# Patient Record
Sex: Female | Born: 1965 | Race: Black or African American | Hispanic: No | Marital: Single | Smoking: Current every day smoker
Health system: Southern US, Community
[De-identification: ages and names within clinical notes are randomized; demographics above are authoritative.]

## PROBLEM LIST (undated history)

## (undated) DIAGNOSIS — I2510 Atherosclerotic heart disease of native coronary artery without angina pectoris: Secondary | ICD-10-CM

## (undated) DIAGNOSIS — I10 Essential (primary) hypertension: Secondary | ICD-10-CM

## (undated) DIAGNOSIS — E7800 Pure hypercholesterolemia, unspecified: Secondary | ICD-10-CM

## (undated) HISTORY — PX: CORONARY ANGIOPLASTY WITH STENT PLACEMENT: SHX49

---

## 2013-06-14 ENCOUNTER — Emergency Department (HOSPITAL_COMMUNITY)
Admission: EM | Admit: 2013-06-14 | Discharge: 2013-06-15 | Disposition: A | Discharge status: Home / Self Care | Payer: Medicaid Other | Attending: Emergency Medicine | Admitting: Emergency Medicine

## 2013-06-14 ENCOUNTER — Encounter (HOSPITAL_COMMUNITY): Payer: Self-pay | Admitting: Emergency Medicine

## 2013-06-14 ENCOUNTER — Emergency Department (HOSPITAL_COMMUNITY): Payer: Medicaid Other

## 2013-06-14 DIAGNOSIS — F17200 Nicotine dependence, unspecified, uncomplicated: Secondary | ICD-10-CM | POA: Insufficient documentation

## 2013-06-14 DIAGNOSIS — I2510 Atherosclerotic heart disease of native coronary artery without angina pectoris: Secondary | ICD-10-CM | POA: Insufficient documentation

## 2013-06-14 DIAGNOSIS — R0602 Shortness of breath: Secondary | ICD-10-CM

## 2013-06-14 DIAGNOSIS — I10 Essential (primary) hypertension: Secondary | ICD-10-CM | POA: Insufficient documentation

## 2013-06-14 DIAGNOSIS — R519 Headache, unspecified: Secondary | ICD-10-CM

## 2013-06-14 DIAGNOSIS — E7800 Pure hypercholesterolemia, unspecified: Secondary | ICD-10-CM | POA: Insufficient documentation

## 2013-06-14 DIAGNOSIS — R51 Headache: Secondary | ICD-10-CM | POA: Insufficient documentation

## 2013-06-14 HISTORY — DX: Atherosclerotic heart disease of native coronary artery without angina pectoris: I25.10

## 2013-06-14 HISTORY — DX: Pure hypercholesterolemia, unspecified: E78.00

## 2013-06-14 HISTORY — DX: Essential (primary) hypertension: I10

## 2013-06-14 LAB — COMPREHENSIVE METABOLIC PANEL
ALT: 20 U/L (ref 0–35)
AST: 29 U/L (ref 0–37)
Albumin: 4.2 g/dL (ref 3.5–5.2)
Alkaline Phosphatase: 73 U/L (ref 39–117)
BUN: 14 mg/dL (ref 6–23)
CALCIUM: 9.8 mg/dL (ref 8.4–10.5)
CO2: 23 mEq/L (ref 19–32)
CREATININE: 0.92 mg/dL (ref 0.50–1.10)
Chloride: 102 mEq/L (ref 96–112)
GFR calc Af Amer: 85 mL/min — ABNORMAL LOW (ref >90)
GFR, EST NON AFRICAN AMERICAN: 74 mL/min — AB (ref >90)
Glucose, Bld: 96 mg/dL (ref 70–99)
Potassium: 3.9 mEq/L (ref 3.7–5.3)
Sodium: 139 mEq/L (ref 137–147)
TOTAL PROTEIN: 7.9 g/dL (ref 6.0–8.3)
Total Bilirubin: 0.2 mg/dL — ABNORMAL LOW (ref 0.3–1.2)

## 2013-06-14 LAB — D-DIMER, QUANTITATIVE (NOT AT ARMC)

## 2013-06-14 LAB — CBC
HCT: 33 % — ABNORMAL LOW (ref 36.0–46.0)
Hemoglobin: 10.8 g/dL — ABNORMAL LOW (ref 12.0–15.0)
MCH: 25.5 pg — ABNORMAL LOW (ref 26.0–34.0)
MCHC: 32.7 g/dL (ref 30.0–36.0)
MCV: 77.8 fL — AB (ref 78.0–100.0)
Platelets: 156 10*3/uL (ref 150–400)
RBC: 4.24 MIL/uL (ref 3.87–5.11)
RDW: 14.2 % (ref 11.5–15.5)
WBC: 9 10*3/uL (ref 4.0–10.5)

## 2013-06-14 LAB — TROPONIN I: Troponin I: <0.3 ng/mL (ref <0.30)

## 2013-06-14 MED ORDER — SODIUM CHLORIDE 0.9 % IV BOLUS (SEPSIS)
1000.0000 mL | Freq: Once | INTRAVENOUS | Status: AC
  Administered 2013-06-14: 1000 mL via INTRAVENOUS

## 2013-06-14 MED ORDER — ASPIRIN 325 MG PO TABS
325.0000 mg | ORAL_TABLET | Freq: Once | ORAL | Status: AC
  Administered 2013-06-14: 325 mg via ORAL
  Filled 2013-06-14: qty 1

## 2013-06-14 MED ORDER — IPRATROPIUM-ALBUTEROL 0.5-2.5 (3) MG/3ML IN SOLN
3.0000 mL | Freq: Once | RESPIRATORY_TRACT | Status: AC
  Administered 2013-06-14: 3 mL via RESPIRATORY_TRACT
  Filled 2013-06-14: qty 3

## 2013-06-14 MED ORDER — KETOROLAC TROMETHAMINE 30 MG/ML IJ SOLN
30.0000 mg | Freq: Once | INTRAMUSCULAR | Status: AC
  Administered 2013-06-14: 30 mg via INTRAVENOUS
  Filled 2013-06-14: qty 1

## 2013-06-14 NOTE — ED Notes (Signed)
MD at bedside. 

## 2013-06-14 NOTE — ED Provider Notes (Signed)
CSN: [id number]     Arrival date & time 06/14/13  2041 History   First MD Initiated Contact with Patient 06/14/13 2112     Chief Complaint  Patient presents with  . Shortness of Breath  . Headache     (Consider location/radiation/quality/duration/timing/severity/associated sxs/prior Treatment) Patient is a 48 y.o. female presenting with shortness of breath. The history is provided by the patient.  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 Hypertension   . High cholesterol   . CAD (coronary artery disease)    Past Surgical History  Procedure Laterality Date  . Coronary angioplasty with stent placement     Family History  Problem Relation Age of Onset  . Hypertension Other   . Diabetes Other   . Cancer Other   . CAD Other    History  Substance Use Topics  . Smoking status: Current Every Day Smoker  . Smokeless tobacco: Not on file  . Alcohol Use: Yes   OB History   Grav Para Term Preterm Abortions TAB SAB Ect Mult Living                 Review of Systems  Constitutional: Negative for fever and chills.  Respiratory: Negative for cough and shortness of breath.   Gastrointestinal: Negative for vomiting and abdominal pain.  All other systems reviewed and are negative.     Allergies  Review of patient's allergies indicates no known allergies.  Home Medications   Prior to Admission medications   Not on File   LMP 06/03/2013 Physical Exam  Nursing note and vitals reviewed. Constitutional: She is oriented  to person, place, and time. She appears well-developed and well-nourished. No distress.  HENT:  Head: Normocephalic and atraumatic.  Mouth/Throat: Oropharynx is clear and moist.  Eyes: EOM are normal. Pupils are equal, round, and reactive to light.  Neck: Normal range of motion. Neck supple.  Cardiovascular: Normal rate and regular rhythm.  Exam reveals no friction rub.   No murmur heard. Pulmonary/Chest: Effort normal and breath sounds normal. No respiratory distress. She has no wheezes. She has no rales.  Abdominal: Soft. She exhibits no distension. There is no tenderness. There is no rebound.  Musculoskeletal: Normal range of motion. She exhibits no edema.  Neurological: She is alert and oriented to person, place, and time. No cranial nerve deficit. She exhibits abnormal muscle tone. Coordination normal.  Skin: She is not diaphoretic.    ED Course  Procedures (including critical care time) Labs Review Labs Reviewed  CBC  COMPREHENSIVE METABOLIC PANEL  TROPONIN I    Imaging Review No results found.   EKG Interpretation   Date/Time:  Thursday June 14 2013 20:50:06 EDT Ventricular Rate:  77 PR Interval:  152 QRS Duration: 81 QT Interval:  420 QTC Calculation: 475 R Axis:   58 Text Interpretation:  Sinus rhythm Left ventricular hypertrophy No prior  Confirmed by Gwendolyn Grant  MD, Shanya Ferriss ([id number]) on 06/14/2013 9:16:55 PM      MDM   Final diagnoses:  Shortness  of breath  Headache    48 roll female presents with sudden onset of shortness of breath according somewhat house. She is a vacuum Soil scientistcleaner saleswoman. Also has had a headache all day today. Woke up with it. [place] in [place] for the past few days. No cough, no fever. No CP. Hx of cardiac stent, initial presentation for that was extreme chest tightness, does not feel like that today. AFVSS here. Hypertensive. Lungs with decreased air movement, belly benign. No wheezing. She is a smoker. Will give aspirin, duoneb.  Will check d-dimer, CXR. Labs ok. Feeling better, on re-exam, sleeping comfortably. Serial troponin normal. Stable for discharge. Encouraged her to f/u with PCP and Cards.  Dagmar HaitWilliam Sandor Arboleda, MD 06/15/13 [phone]

## 2013-06-14 NOTE — ED Notes (Signed)
Pt states she had a sudden onset of shortness of breath about an hour ago  Pt states she has pain in the back of her head that started last night  Pt states she has been off her medications for about a month  Pt is unsure what she is supposed to take  Pt had a cardiac stent put in last July  Pt states her eating habits have been bad lately

## 2013-06-15 LAB — I-STAT TROPONIN, ED: TROPONIN I, POC: 0.01 ng/mL (ref 0.00–0.08)

## 2013-06-15 NOTE — Discharge Instructions (Signed)
Shortness of Breath Shortness of breath means you have trouble breathing. Shortness of breath may indicate that you have a medical problem. You should seek immediate medical care for shortness of breath. CAUSES   Not enough oxygen in the air (as with high altitudes or a smoke-filled room).  Short-term (acute) lung disease, including:  Infections, such as pneumonia.  Fluid in the lungs, such as heart failure.  A blood clot in the lungs (pulmonary embolism).  Long-term (chronic) lung diseases.  Heart disease (heart attack, angina, heart failure, and others).  Low red blood cells (anemia).  Poor physical fitness. This can cause shortness of breath when you exercise.  Chest or back injuries or stiffness.  Being overweight.  Smoking.  Anxiety. This can make you feel like you are not getting enough air. DIAGNOSIS  Serious medical problems can usually be found during your physical exam. Tests may also be done to determine why you are having shortness of breath. Tests may include:  Chest X-rays.  Lung function tests.  Blood tests.  Electrocardiography.  Exercise testing.  Echocardiography.  Imaging scans. Your caregiver may not be able to find a cause for your shortness of breath after your exam. In this case, it is important to have a follow-up exam with your caregiver as directed.  TREATMENT  Treatment for shortness of breath depends on the cause of your symptoms and can vary greatly. HOME CARE INSTRUCTIONS   Do not smoke. Smoking is a common cause of shortness of breath. If you smoke, ask for help to quit.  Avoid being around chemicals or things that may bother your breathing, such as paint fumes and dust.  Rest as needed. Slowly resume your usual activities.  If medicines were prescribed, take them as directed for the full length of time directed. This includes oxygen and any inhaled medicines.  Keep all follow-up appointments as directed by your caregiver. SEEK  MEDICAL CARE IF:   Your condition does not improve in the time expected.  You have a hard time doing your normal activities even with rest.  You have any side effects or problems with the medicines prescribed.  You develop any new symptoms. SEEK IMMEDIATE MEDICAL CARE IF:   Your shortness of breath gets worse.  You feel lightheaded, faint, or develop a cough not controlled with medicines.  You start coughing up blood.  You have pain with breathing.  You have chest pain or pain in your arms, shoulders, or abdomen.  You have a fever.  You are unable to walk up stairs or exercise the way you normally do. MAKE SURE YOU:  Understand these instructions.  Will watch your condition.  Will get help right away if you are not doing well or get worse. Document Released: 09/15/2000 Document Revised: 06/22/2011 Document Reviewed: 03/08/2011 [facility] Patient Information 2014 [place], [place].   Emergency Department Resource Guide 1) Find a Doctor and Pay Out of Pocket Although you won't have to find out who is covered by your insurance plan, it is a good idea to ask around and get recommendations. You will then need to call the office and see if the doctor you have chosen will accept you as a new patient and what types of options they offer for patients who are self-pay. Some doctors offer discounts or will set up payment plans for their patients who do not have insurance, but you will need to ask so you aren't surprised when you get to your appointment.  2) Contact Your Local  Health Department Not all health departments have doctors that can see patients for sick visits, but many do, so it is worth a call to see if yours does. If you don't know where your local health department is, you can check in your phone book. The CDC also has a tool to help you locate your state's health department, and many state websites also have listings of all of their local health departments.  3) Find a  [facility] If your illness is not likely to be very severe or complicated, you may want to try a walk in clinic. These are popping up all over the country in pharmacies, drugstores, and shopping centers. They're usually staffed by nurse practitioners or physician assistants that have been trained to treat common illnesses and complaints. They're usually fairly quick and inexpensive. However, if you have serious medical issues or chronic medical problems, these are probably not your best option.  No Primary Care Doctor: - Call Health Connect at  [phone] - they can help you locate a primary care doctor that  accepts your insurance, provides certain services, etc. - Physician Referral Service- [phone]  Chronic Pain Problems: Organization         Address  Phone   Notes  [facility]  [phone] Patients need to be referred by their primary care doctor.   Medication Assistance: Organization         Address  Phone   Notes  [facility] Medication [facility] [address] [place], [place] [address] [phone] --Must be a resident of [facility] -- Must have NO insurance coverage whatsoever (no Medicaid/ Medicare, etc.) -- The pt. MUST have a primary care doctor that directs their care regularly and follows them in the community   MedAssist  [phone]   [employer]  [phone]    Agencies that provide inexpensive medical care: Organization         Address  Phone   Notes  [place]  [phone]   [facility]    [phone]   [facility] [place], [place] [address] [phone]   [place] [address], [place] [phone]   Planned Parenthood    [phone]   [facility]    [phone]   [place] and [place] [address], [place] Phone:  [phone], Fax:  [phone] Hours of Operation:  9 am - 6 pm, M-F.  Also accepts Medicaid/Medicare and self-pay.  [facility] for [place], [address], [place] Phone: [phone], Fax: [phone]. Hours of Operation:  8:30 am - 5:30 pm, M-F.  Also accepts Medicaid and self-pay.  [facility] [address], [place] Phone: [phone]   [place], [place], [place] [phone], Ext. [phone] Mondays & Thursdays: 7-9 AM.  First 15 patients are seen on a first come, first serve basis.    [place] Providers:  Organization         Address  Phone   Notes  [facility] [address], [place] [phone] Also accepts self-pay patients.  [place], [place]  [phone]   [place], [place], [place] [phone]  [place] [address] [place] [phone]   Lucianne Lei [address], [place]   [phone] Only accepts [place] Access [place] patients after they have their name applied to their card.   Self-Pay (no insurance) in [facility]:  Organization         Address  Phone   Notes  Sickle Cell Patients, [facility] [place] [phone]   [facility] [place] [phone]   [facility] [place]  [place], [place], [place] [phone]   [facility]/Dr. Osei-Bonsu  [address], [place] or [address], [place] [phone] Phone number for both [place] and [place] locations is the same.  Urgent Medical and [facility] [address] [place] [phone]   [facility] [address] [place] or [address] [phone]     [facility] [address], [place] [phone], phone; [phone], fax Sees patients 1st and 3rd Saturday of every month.  Must not qualify for public or private insurance (i.e. Medicaid, Medicare, [place] Health Choice, Veterans' Benefits)  Household income should be no more than 200% of the poverty level The clinic cannot treat you if you are pregnant or think you are pregnant  Sexually transmitted diseases are not treated at the clinic.    Dental Care: Organization         Address  Phone  Notes  [facility] [place] [phone] Accepts children up to age 90 who are enrolled in [place] or [place]; pregnant women with a Medicaid card; and children who have applied for Medicaid or [place] Health Choice, but were declined, whose parents can pay a reduced fee at time of service.  [facility] Department of [facility]  [address], [place] [phone] Accepts children up to age 74 who are enrolled in [place] or [place]; pregnant women with a Medicaid card; and children who have applied for Medicaid or  Health Choice, but were declined, whose parents can pay a reduced fee at time of service.  [place] Adult Dental Access PROGRAM  [place] [phone] Patients are seen by appointment only. Walk-ins are not accepted. [place] will see patients 47 years of age and older. Monday - Tuesday (8am-5pm) Most Wednesdays (8:30-5pm) $30 per visit, cash only  [facility] Adult Dental Access PROGRAM  [address], [facility] [phone] Patients are seen by appointment only. Walk-ins are not accepted. [place] will see patients 87 years of age and older. One Wednesday Evening (Monthly: Volunteer Based).  $30 per visit, cash only  [place]  [phone] for adults; Children under age 38, call  Graduate Pediatric Dentistry at [phone]. Children aged 56-14, please call [phone] to request a pediatric application.  Dental services are provided in all areas of dental care including fillings, crowns and bridges, complete and partial dentures, implants, gum treatment, root canals, and extractions. Preventive care is also provided. Treatment is provided to both adults and children. Patients are selected via a lottery and there is often a waiting list.   [facility] [address], [place]  [phone] [email or web address]  [facility] [address] [place], [place] [phone], Ext. [phone] Second and Fourth Thursday of each month, opens at 6:30 AM; Clinic ends at 9 AM.  Patients are seen on a first-come first-served basis, and a limited number are seen during each clinic.   [facility]  [address] Hillard Danker [place], [place] [phone]   Eligibility Requirements You must have lived in [place], [place], or [place] counties for at least the last three months.   You cannot be eligible for state or federal sponsored [employer], including [employer], [place], or [employer].   You generally cannot be eligible for healthcare insurance through your employer.    How to apply: Eligibility screenings are held every Tuesday and Wednesday afternoon from 1:00 pm until 4:00 pm. You do not need an appointment for the interview!  [facility] [address] [place], [place]   [place]  [place] Department  [place]  [phone]    Behavioral Health Resources in the Community: Intensive Outpatient Programs Organization         Address  Phone  Notes  [place]. [address], [place], [place] [phone]   [facility] [address] [place], [place]   ADS: [facility] [address] [place], [place]   [place] [address]  [place], [place] or [phone]   Substance Abuse Resources Organization         Address  Phone  Notes  Alcohol and Drug Services  [phone]   [place]  [phone]   [place]   Chinita Pester  [phone]   Residential & Outpatient Substance Abuse Program  [phone]   Psychological Services Organization         Address  Phone  Notes  [facility]  [place]  [phone]   [place] [address], [place] or [phone]    Mobile Crisis Teams Organization         Address  Phone  Notes  [facility], Mobile Crisis Care Unit  [phone]   [facility]  [address] [place], [place]   [place] [address], [place] [phone]    Self-Help/Support Groups Organization         Address  Phone             Notes  [place]. of [place] - variety of support groups  [place] Call for more information  Narcotics Anonymous (NA), [facility] [address], [employer] [place]  2 meetings at this location   Special educational needs teacher         Address  Phone  Notes  [facility] [place],    [place]  [phone]   [facility]  [address] [place] [phone], [place], [place]   [place], [place] [phone] Admissions: 8am-3pm M-F  Incentives Substance [place] [address],    [place], [place] [phone]   [facility] [address] Jadene Pierini [place], [place]   [facility] [address]  [place], [place]   [facility] Programs - Intensive Outpatient [address] [address], [place], [place] [phone]   °[facility] ([facility] [address]  °[place], [place] [phone] or [phone]   °[facility] ([facility]) [address] [place], [place] [phone] Accepts Medicaid  °[facility] [address]  ° [place] 1-800-659-3381 Substance Abuse/Addiction Treatment  ° °[place] Behavioral Health Resources °Organization         Address  Phone  Notes  °[facility]  ([phone]) [phone]   °Julie Brannon, PhD [address] [place], [place]   ([phone]) [phone] or (336) 951-0000   °[facility]   [address] °[place], [place] (336) 349-4454   °[facility] [address], [place], [place] ([phone]) [phone] Insurance/Medicaid/sponsorship through [facility]  °[facility] [address]                                    [place], [place] [phone]) [phone] Therapy/tele-psych/case  °[facility] [address]  ° [place], [place] ([phone]) [phone]    °Dr. Arfeen  ([phone]) [phone]   °[facility] [place]  [employer] [place] [facility] 1) [address], [place] °2) [address], [place] °3)  [address], [place] (336) 349-3220 °(336) 342-7768 ° °(336) 342-8140   °[place] Child Abuse Hotline (336) 342-1394 or (336) 342-3537 (After Hours)    ° ° ° °

## 2015-12-30 IMAGING — CR DG CHEST 2V
2 series · 2 of 2 positions shown · non-contrast
Comparison: None.

CLINICAL DATA: Short of breath

EXAM:
CHEST  2 VIEW

[w chest lat]
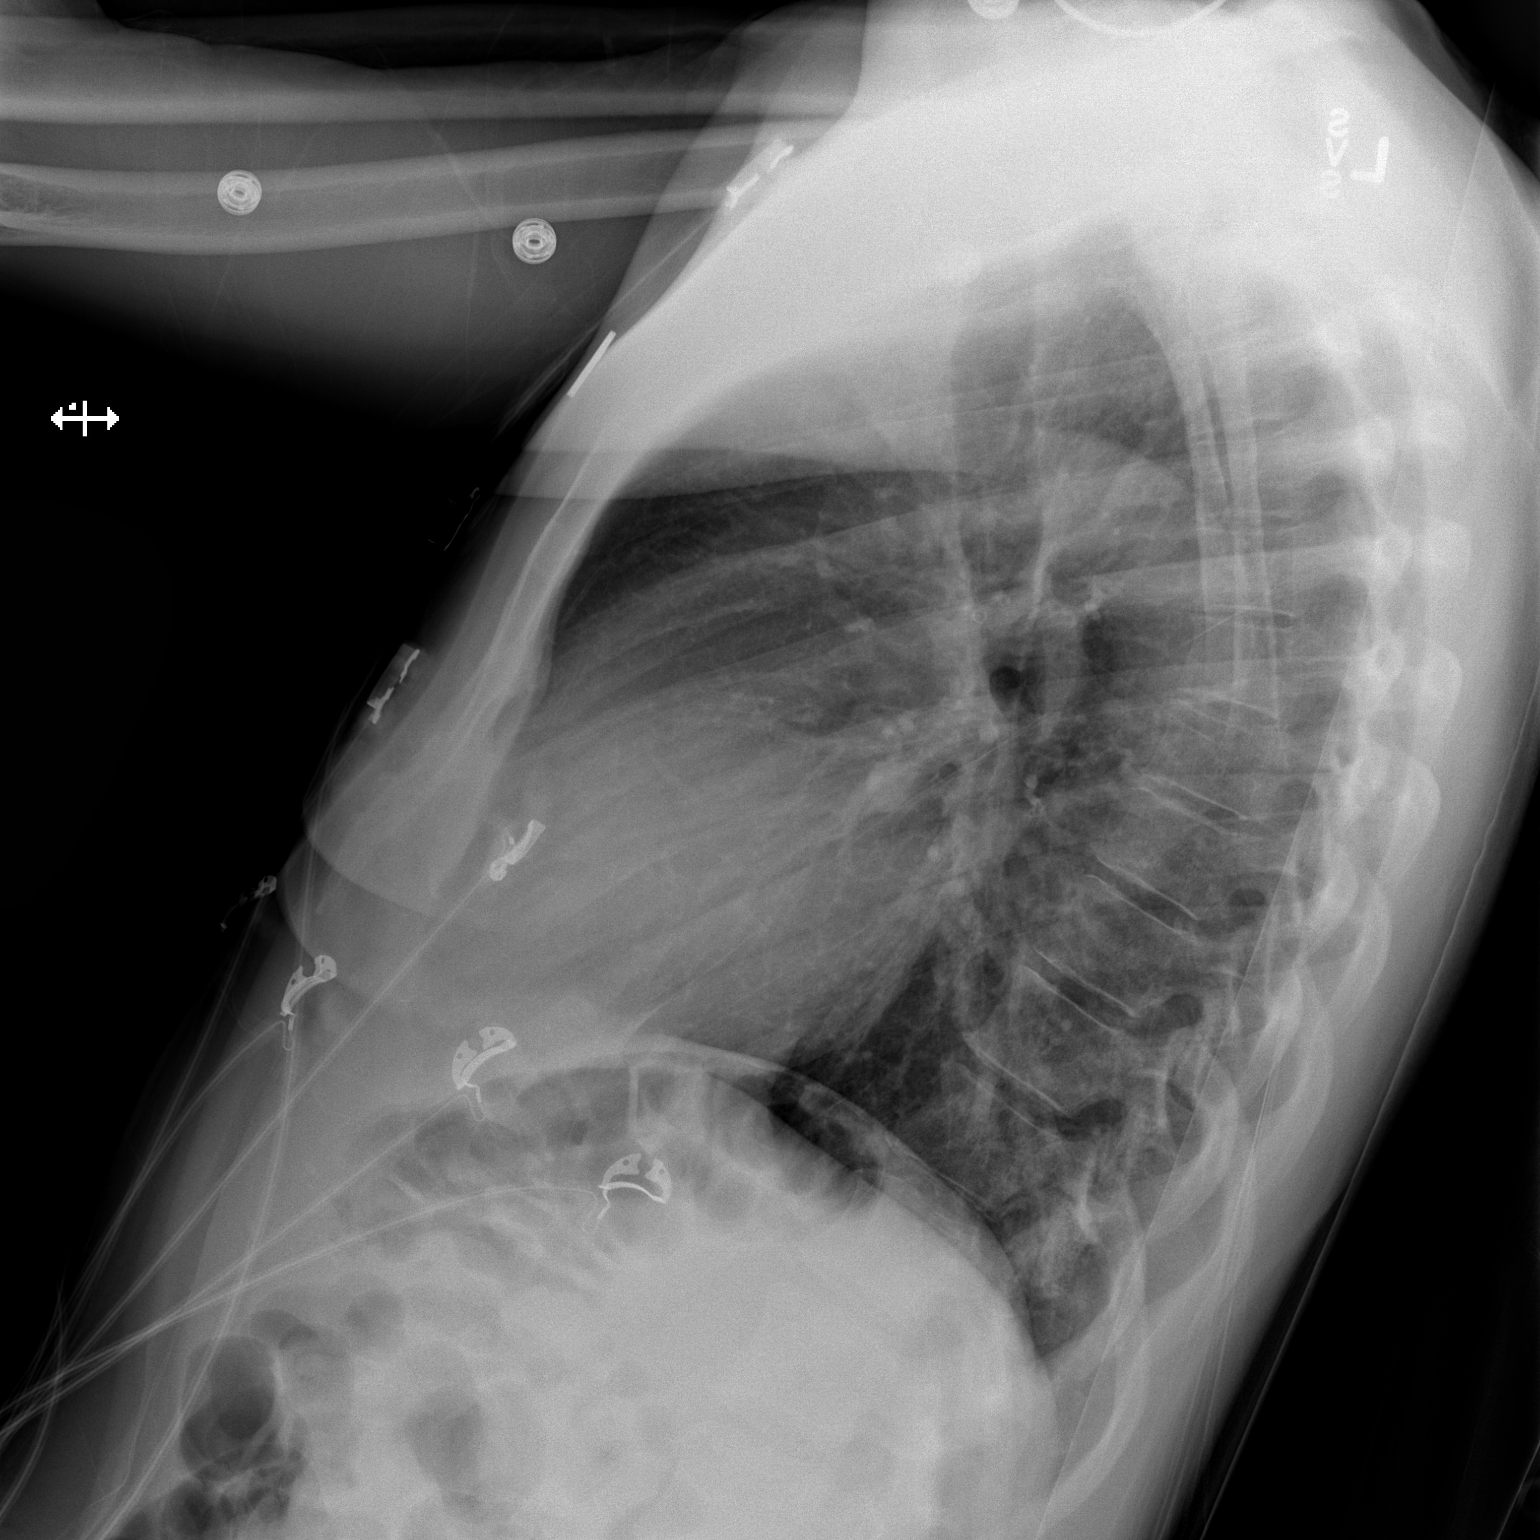

[x chest ap]
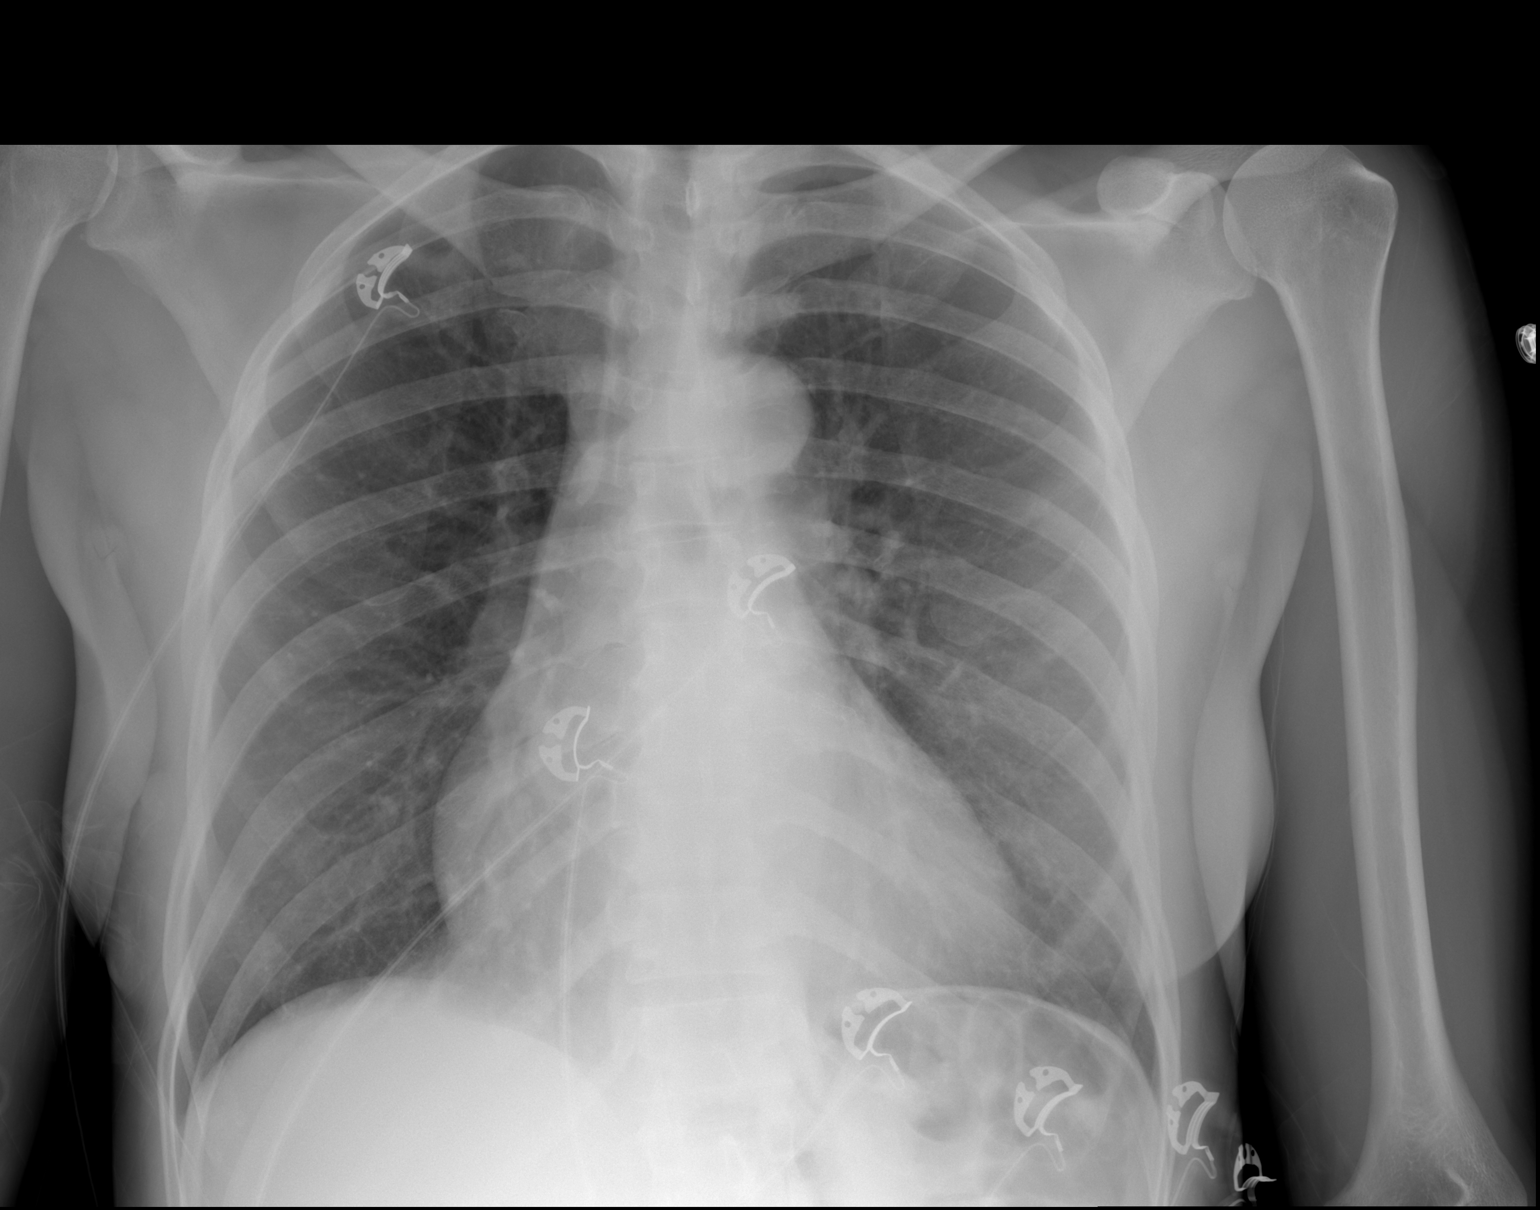

[2 of 2 positions shown; findings below may reference images not displayed]

FINDINGS: Mild cardiomegaly. Normal vascularity. No pneumothorax or pleural
effusion.
IMPRESSION: Cardiomegaly.  No active cardiopulmonary disease.
